# Patient Record
Sex: Female | Born: 1960
Health system: Southern US, Community
[De-identification: ages and names within clinical notes are randomized; demographics above are authoritative.]

## PROBLEM LIST (undated history)

## (undated) DIAGNOSIS — E039 Hypothyroidism, unspecified: Secondary | ICD-10-CM

## (undated) DIAGNOSIS — E079 Disorder of thyroid, unspecified: Secondary | ICD-10-CM

## (undated) DIAGNOSIS — I10 Essential (primary) hypertension: Secondary | ICD-10-CM

## (undated) HISTORY — PX: OTHER SURGICAL HISTORY: SHX169

## (undated) HISTORY — DX: Essential (primary) hypertension: I10

## (undated) HISTORY — DX: Disorder of thyroid, unspecified: E07.9

## (undated) HISTORY — PX: TUBAL LIGATION: SHX77

## (undated) HISTORY — PX: BREAST SURGERY: SHX581

---

## 2008-05-25 ENCOUNTER — Emergency Department (HOSPITAL_COMMUNITY): Admission: EM | Admit: 2008-05-25 | Discharge: 2008-05-25 | Payer: Self-pay | Admitting: Emergency Medicine

## 2008-12-10 ENCOUNTER — Emergency Department (HOSPITAL_COMMUNITY): Admission: AC | Admit: 2008-12-10 | Discharge: 2008-12-10 | Payer: Self-pay

## 2009-10-30 ENCOUNTER — Ambulatory Visit (HOSPITAL_COMMUNITY): Admission: RE | Admit: 2009-10-30 | Discharge: 2009-10-30 | Payer: Self-pay | Admitting: Family Medicine

## 2010-06-02 ENCOUNTER — Emergency Department (HOSPITAL_COMMUNITY)
Admission: EM | Admit: 2010-06-02 | Discharge: 2010-06-02 | Payer: Self-pay | Source: Home / Self Care | Admitting: Emergency Medicine

## 2010-06-11 ENCOUNTER — Emergency Department (HOSPITAL_COMMUNITY)
Admission: EM | Admit: 2010-06-11 | Discharge: 2010-06-11 | Payer: Self-pay | Source: Home / Self Care | Admitting: Emergency Medicine

## 2010-09-20 LAB — CBC
MCHC: 34.5 g/dL (ref 30.0–36.0)
Platelets: 217 10*3/uL (ref 150–400)
RBC: 4.64 MIL/uL (ref 3.87–5.11)

## 2010-09-20 LAB — DIFFERENTIAL
Basophils Absolute: 0 10*3/uL (ref 0.0–0.1)
Basophils Relative: 0 % (ref 0–1)
Monocytes Relative: 8 % (ref 3–12)
Neutro Abs: 2.5 10*3/uL (ref 1.7–7.7)
Neutrophils Relative %: 45 % (ref 43–77)

## 2010-09-20 LAB — POCT I-STAT, CHEM 8
Calcium, Ion: 1.16 mmol/L (ref 1.12–1.32)
HCT: 40 % (ref 36.0–46.0)
TCO2: 23 mmol/L (ref 0–100)

## 2010-09-20 LAB — BASIC METABOLIC PANEL
BUN: 14 mg/dL (ref 6–23)
CO2: 26 mEq/L (ref 19–32)
Calcium: 9.2 mg/dL (ref 8.4–10.5)
Creatinine, Ser: 0.6 mg/dL (ref 0.4–1.2)
GFR calc Af Amer: >60 mL/min (ref >60)

## 2010-09-20 LAB — PROTIME-INR
INR: 1.1 (ref 0.00–1.49)
Prothrombin Time: 14.3 seconds (ref 11.6–15.2)

## 2010-09-20 LAB — URINALYSIS, ROUTINE W REFLEX MICROSCOPIC
Bilirubin Urine: NEGATIVE
Ketones, ur: NEGATIVE mg/dL
Nitrite: POSITIVE — AB
Protein, ur: NEGATIVE mg/dL
Specific Gravity, Urine: 1.013 (ref 1.005–1.030)
Urobilinogen, UA: 0.2 mg/dL (ref 0.0–1.0)

## 2010-09-20 LAB — APTT: aPTT: 28 seconds (ref 24–37)

## 2011-02-11 ENCOUNTER — Other Ambulatory Visit (HOSPITAL_COMMUNITY): Payer: Self-pay | Admitting: Family Medicine

## 2011-02-11 DIAGNOSIS — N644 Mastodynia: Secondary | ICD-10-CM

## 2011-02-16 ENCOUNTER — Ambulatory Visit (HOSPITAL_COMMUNITY): Payer: Self-pay

## 2011-02-23 ENCOUNTER — Ambulatory Visit (HOSPITAL_COMMUNITY)
Admission: RE | Admit: 2011-02-23 | Discharge: 2011-02-23 | Disposition: A | Discharge status: Home / Self Care | Payer: Self-pay | Source: Ambulatory Visit | Attending: Family Medicine | Admitting: Family Medicine

## 2011-02-23 ENCOUNTER — Other Ambulatory Visit (HOSPITAL_COMMUNITY): Payer: Self-pay | Admitting: Family Medicine

## 2011-02-23 DIAGNOSIS — N644 Mastodynia: Secondary | ICD-10-CM

## 2011-03-18 LAB — STREP A DNA PROBE: Group A Strep Probe: NEGATIVE

## 2012-12-03 ENCOUNTER — Telehealth: Payer: Self-pay | Admitting: *Deleted

## 2012-12-03 ENCOUNTER — Ambulatory Visit: Payer: Self-pay | Admitting: Pharmacist

## 2012-12-03 LAB — POCT INR: INR: 1.6

## 2012-12-03 NOTE — Telephone Encounter (Signed)
This is the wrong patient.  INR was suppose to be for W. Cleotis Lema.  DOB: 01/11/1926 Contacted [facility] to verify

## 2012-12-03 NOTE — Telephone Encounter (Signed)
Protime 19.2 INR 1.6 Takes 6 mg on Tues, Thurs and Sat,  4mg  all other days

## 2012-12-03 NOTE — Telephone Encounter (Signed)
This is wrong patient.  Encounter corrected

## 2012-12-03 NOTE — Progress Notes (Signed)
erranous encounter

## 2013-10-21 ENCOUNTER — Encounter: Payer: Self-pay | Admitting: Obstetrics & Gynecology

## 2013-10-28 ENCOUNTER — Ambulatory Visit (INDEPENDENT_AMBULATORY_CARE_PROVIDER_SITE_OTHER): Payer: Self-pay | Admitting: Obstetrics & Gynecology

## 2013-10-28 ENCOUNTER — Encounter: Payer: Self-pay | Admitting: Obstetrics & Gynecology

## 2013-10-28 ENCOUNTER — Other Ambulatory Visit (HOSPITAL_COMMUNITY)
Admission: RE | Admit: 2013-10-28 | Discharge: 2013-10-28 | Disposition: A | Discharge status: Home / Self Care | Payer: Self-pay | Source: Ambulatory Visit | Attending: Obstetrics & Gynecology | Admitting: Obstetrics & Gynecology

## 2013-10-28 VITALS — BP 120/80 | Ht 63.0 in | Wt 163.0 lb

## 2013-10-28 DIAGNOSIS — Z01419 Encounter for gynecological examination (general) (routine) without abnormal findings: Secondary | ICD-10-CM | POA: Insufficient documentation

## 2013-10-28 DIAGNOSIS — Z1151 Encounter for screening for human papillomavirus (HPV): Secondary | ICD-10-CM | POA: Insufficient documentation

## 2013-10-28 DIAGNOSIS — Z124 Encounter for screening for malignant neoplasm of cervix: Secondary | ICD-10-CM

## 2013-10-28 DIAGNOSIS — D259 Leiomyoma of uterus, unspecified: Secondary | ICD-10-CM

## 2013-10-28 DIAGNOSIS — N920 Excessive and frequent menstruation with regular cycle: Secondary | ICD-10-CM | POA: Insufficient documentation

## 2013-10-28 MED ORDER — MEGESTROL ACETATE 40 MG PO TABS: ORAL_TABLET | ORAL | Status: DC

## 2013-10-28 NOTE — Progress Notes (Signed)
Patient ID: Heather Zavala, female   DOB: 01-08-1961, 53 y.o.   MRN: [id number] Heather Zavala is her first visit with [place] G3P3   Presents complaining two-month history of increasingly heavy periods with now bleeding every 2 weeks  Heavy clotting very painful  Say she feels like she is cramping all the time   She also complains of some epigastric symptoms and reflux  She is going to her stressful time she is recently lost her brother   Review of systems  Per history of present illness  No urinary symptoms  The lower GI symptoms  No fever chills or other constitutional symptoms   Exam  Normal external genitalia  Vagina was painted no discharge  Cervix is nulliparous Pap smear is obtained  Uterus 12 week size fibroid uterus tender to palpation  Adnexa negative   Impression  Enlarged fibroid uterus, 12 week size  Menometrorrhagia worsening over the past couple months   Plan  Megestrol to control her cycles  Sonogram in 2 weeks for evaluation of uterus   At the day of her ultrasound will have a visit and a side-opening future management

## 2013-11-11 ENCOUNTER — Other Ambulatory Visit: Payer: Self-pay

## 2013-11-14 ENCOUNTER — Ambulatory Visit (INDEPENDENT_AMBULATORY_CARE_PROVIDER_SITE_OTHER): Payer: Self-pay

## 2013-11-14 ENCOUNTER — Encounter: Payer: Self-pay | Admitting: Obstetrics & Gynecology

## 2013-11-14 ENCOUNTER — Ambulatory Visit (INDEPENDENT_AMBULATORY_CARE_PROVIDER_SITE_OTHER): Payer: Self-pay | Admitting: Obstetrics & Gynecology

## 2013-11-14 VITALS — BP 130/80 | Wt 165.0 lb

## 2013-11-14 DIAGNOSIS — N920 Excessive and frequent menstruation with regular cycle: Secondary | ICD-10-CM

## 2013-11-14 DIAGNOSIS — D259 Leiomyoma of uterus, unspecified: Secondary | ICD-10-CM

## 2013-11-14 MED ORDER — MEGESTROL ACETATE 40 MG PO TABS: ORAL_TABLET | ORAL | Status: DC

## 2013-11-14 NOTE — Progress Notes (Signed)
Patient ID: Heather Zavala, female   DOB: Jan 27, 1961, 53 y.o.   MRN: [id number] Pt here for follow up of her megestrol and to review her sonogram  US Pelvis Complete  11/14/2013   GYNECOLOGIC SONOGRAM   Heather Zavala is a 53 y.o. LMP 10/21/2013 for a pelvic sonogram for  menorrhagia and ?uterine fibroids per PE by Dr.Ripley Lovecchio.  Uterus                    9.0 x 6.2 x 4.1 cm, no obvious myometrial masses  noted within  Endometrium          7.0 mm, symmetrical,   Right ovary             1.9 x 1.2 x 1.1 cm,   Left ovary                1.8 x 1.3 x 1.3 cm,   No free fluid or adnexal masses noted within the pelvis  Technician Comments:  Endom-7.59mm, bilateral adnexa/ovaries appear WNL no free fluid or adnexal  masses noted within the pelvis   [name] 11/14/2013 2:29 PM  Normal pelvic anatomy  [name] 11/14/2013 3:26 PM     No bleeding on megace No problems  No burning with urination, frequency or urgency No nausea, vomiting or diarrhea Nor fever chills or other constitutional symptoms  Exam Normal gyn pathology  Assessment: Perimenopausal menorrhagia  Plan:  Responded to megestrol Pt wants to continue Will continue, rx redone Follow up prn or 1 year  Past Medical History  Diagnosis Date  . Hypertension   . Thyroid disease     Past Surgical History  Procedure Laterality Date  . Cesarean section    . Tubal reversal      OB History   Grav Para Term Preterm Abortions TAB SAB Ect Mult Living                  Allergies  Allergen Reactions  . Asa [Aspirin] Anaphylaxis    History   Social History  . Marital Status: Legally Separated    Spouse Name: N/A    Number of Children: N/A  . Years of Education: N/A   Social History Main Topics  . Smoking status: Never Smoker   . Smokeless tobacco: None  . Alcohol Use: None  . Drug Use: None  . Sexual Activity: Yes   Other Topics Concern  . None   Social History Narrative  . None    Family History  Problem  Relation Age of Onset  . Breast cancer Mother

## 2013-12-27 ENCOUNTER — Ambulatory Visit (INDEPENDENT_AMBULATORY_CARE_PROVIDER_SITE_OTHER): Payer: Self-pay | Admitting: Obstetrics & Gynecology

## 2013-12-27 ENCOUNTER — Encounter: Payer: Self-pay | Admitting: Obstetrics & Gynecology

## 2013-12-27 VITALS — BP 130/90 | Wt 169.0 lb

## 2013-12-27 DIAGNOSIS — N946 Dysmenorrhea, unspecified: Secondary | ICD-10-CM

## 2013-12-27 DIAGNOSIS — N920 Excessive and frequent menstruation with regular cycle: Secondary | ICD-10-CM

## 2013-12-27 LAB — POCT HEMOGLOBIN: HEMOGLOBIN: 14.9 g/dL (ref 12.2–16.2)

## 2013-12-27 MED ORDER — ONDANSETRON HCL 8 MG PO TABS: 8.0000 mg | ORAL_TABLET | Freq: Three times a day (TID) | ORAL | Status: DC | PRN

## 2013-12-27 MED ORDER — HYDROCODONE-ACETAMINOPHEN 5-325 MG PO TABS: 1.0000 | ORAL_TABLET | Freq: Four times a day (QID) | ORAL | Status: DC | PRN

## 2013-12-27 NOTE — Addendum Note (Signed)
Addended by: Doyne Keel on: 12/27/2013 10:42 AM   Modules accepted: Orders

## 2013-12-27 NOTE — Progress Notes (Signed)
Patient ID: Heather Zavala, female   DOB: 26-Nov-1960, 53 y.o.   MRN: [id number] Preoperative History and Physical  Heather Zavala is a 53 y.o. No obstetric history on file. with Patient's last menstrual period was 12/14/2013. admitted for a hysteroscopy uterine curettage endometrial ablation.  Pt was managed on megestrol but now bleeding heaviloy again, sonogram is normal  PMH:    Past Medical History  Diagnosis Date  . Hypertension   . Thyroid disease     PSH:     Past Surgical History  Procedure Laterality Date  . Cesarean section    . Tubal reversal      POb/GynH:      OB History   Grav Para Term Preterm Abortions TAB SAB Ect Mult Living                  SH:   History  Substance Use Topics  . Smoking status: Never Smoker   . Smokeless tobacco: Not on file  . Alcohol Use: Not on file    FH:    Family History  Problem Relation Age of Onset  . Breast cancer Mother      Allergies:  Allergies  Allergen Reactions  . Asa [Aspirin] Anaphylaxis    Medications:      Current outpatient prescriptions:atenolol (TENORMIN) 25 MG tablet, Take 25 mg by mouth daily., Disp: , Rfl: ;  hydrochlorothiazide (HYDRODIURIL) 25 MG tablet, Take 25 mg by mouth daily., Disp: , Rfl: ;  levothyroxine (SYNTHROID, LEVOTHROID) 25 MCG tablet, Take 25 mcg by mouth daily before breakfast., Disp: , Rfl: ;  megestrol (MEGACE) 40 MG tablet, Take 1 tablet a day, Disp: 30 tablet, Rfl: 11 HYDROcodone-acetaminophen (NORCO/VICODIN) 5-325 MG per tablet, Take 1 tablet by mouth every 6 (six) hours as needed., Disp: 30 tablet, Rfl: 0;  ondansetron (ZOFRAN) 8 MG tablet, Take 1 tablet (8 mg total) by mouth every 8 (eight) hours as needed for nausea., Disp: 24 tablet, Rfl: 1  Review of Systems:   Review of Systems  Constitutional: Negative for fever, chills, weight loss, malaise/fatigue and diaphoresis.  HENT: Negative for hearing loss, ear pain, nosebleeds, congestion, sore throat, neck pain, tinnitus and  ear discharge.   Eyes: Negative for blurred vision, double vision, photophobia, pain, discharge and redness.  Respiratory: Negative for cough, hemoptysis, sputum production, shortness of breath, wheezing and stridor.   Cardiovascular: Negative for chest pain, palpitations, orthopnea, claudication, leg swelling and PND.  Gastrointestinal: Positive for abdominal pain. Negative for heartburn, nausea, vomiting, diarrhea, constipation, blood in stool and melena.  Genitourinary: Negative for dysuria, urgency, frequency, hematuria and flank pain.  Musculoskeletal: Negative for myalgias, back pain, joint pain and falls.  Skin: Negative for itching and rash.  Neurological: Negative for dizziness, tingling, tremors, sensory change, speech change, focal weakness, seizures, loss of consciousness, weakness and headaches.  Endo/Heme/Allergies: Negative for environmental allergies and polydipsia. Does not bruise/bleed easily.  Psychiatric/Behavioral: Negative for depression, suicidal ideas, hallucinations, memory loss and substance abuse. The patient is not nervous/anxious and does not have insomnia.      PHYSICAL EXAM:  Blood pressure 130/90, weight 169 lb (76.658 kg), last menstrual period 12/14/2013.    Vitals reviewed. Constitutional: She is oriented to person, place, and time. She appears well-developed and well-nourished.  HENT:  Head: Normocephalic and atraumatic.  Right Ear: External ear normal.  Left Ear: External ear normal.  Nose: Nose normal.  Mouth/Throat: Oropharynx is clear and moist.  Eyes: Conjunctivae and EOM are normal. Pupils  are equal, round, and reactive to light. Right eye exhibits no discharge. Left eye exhibits no discharge. No scleral icterus.  Neck: Normal range of motion. Neck supple. No tracheal deviation present. No thyromegaly present.  Cardiovascular: Normal rate, regular rhythm, normal heart sounds and intact distal pulses.  Exam reveals no gallop and no friction rub.    No murmur heard. Respiratory: Effort normal and breath sounds normal. No respiratory distress. She has no wheezes. She has no rales. She exhibits no tenderness.  GI: Soft. Bowel sounds are normal. She exhibits no distension and no mass. There is tenderness. There is no rebound and no guarding.  Genitourinary:       Vulva is normal without lesions Vagina is pink moist without discharge Cervix normal in appearance and pap is normal Uterus is normal Adnexa is negative with normal sized ovaries by sonogram  Musculoskeletal: Normal range of motion. She exhibits no edema and no tenderness.  Neurological: She is alert and oriented to person, place, and time. She has normal reflexes. She displays normal reflexes. No cranial nerve deficit. She exhibits normal muscle tone. Coordination normal.  Skin: Skin is warm and dry. No rash noted. No erythema. No pallor.  Psychiatric: She has a normal mood and affect. Her behavior is normal. Judgment and thought content normal.    Labs: No results found for this or any previous visit (from the past 336 hour(s)).  EKG: No orders found for this or any previous visit.  Imaging Studies: No results found.    Assessment: Patient Active Problem List   Diagnosis Date Noted  . Excessive or frequent menstruation 10/28/2013    Plan: Hysteroscopy uterine curettage endometrial ablation  EURE,LUTHER H 12/27/2013 10:25 AM

## 2013-12-30 ENCOUNTER — Encounter (HOSPITAL_COMMUNITY): Payer: Self-pay | Admitting: Pharmacy Technician

## 2013-12-31 ENCOUNTER — Encounter (HOSPITAL_COMMUNITY)
Admission: RE | Admit: 2013-12-31 | Discharge: 2013-12-31 | Disposition: A | Discharge status: Home / Self Care | Payer: Self-pay | Source: Ambulatory Visit | Attending: Obstetrics & Gynecology | Admitting: Obstetrics & Gynecology

## 2013-12-31 ENCOUNTER — Encounter (HOSPITAL_COMMUNITY): Payer: Self-pay

## 2013-12-31 ENCOUNTER — Other Ambulatory Visit: Payer: Self-pay

## 2013-12-31 DIAGNOSIS — Z01810 Encounter for preprocedural cardiovascular examination: Secondary | ICD-10-CM | POA: Insufficient documentation

## 2013-12-31 DIAGNOSIS — Z01812 Encounter for preprocedural laboratory examination: Secondary | ICD-10-CM | POA: Insufficient documentation

## 2013-12-31 HISTORY — DX: Hypothyroidism, unspecified: E03.9

## 2013-12-31 LAB — CBC
HCT: 41.2 % (ref 36.0–46.0)
HEMOGLOBIN: 14.5 g/dL (ref 12.0–15.0)
MCH: 29.8 pg (ref 26.0–34.0)
MCHC: 35.2 g/dL (ref 30.0–36.0)
MCV: 84.6 fL (ref 78.0–100.0)
Platelets: 247 10*3/uL (ref 150–400)
RBC: 4.87 MIL/uL (ref 3.87–5.11)
RDW: 13.2 % (ref 11.5–15.5)
WBC: 8.2 10*3/uL (ref 4.0–10.5)

## 2013-12-31 LAB — URINE MICROSCOPIC-ADD ON

## 2013-12-31 LAB — URINALYSIS, ROUTINE W REFLEX MICROSCOPIC
Bilirubin Urine: NEGATIVE
Glucose, UA: NEGATIVE mg/dL
Ketones, ur: NEGATIVE mg/dL
Leukocytes, UA: NEGATIVE
NITRITE: NEGATIVE
PROTEIN: NEGATIVE mg/dL
Specific Gravity, Urine: 1.025 (ref 1.005–1.030)
UROBILINOGEN UA: 0.2 mg/dL (ref 0.0–1.0)
pH: 5.5 (ref 5.0–8.0)

## 2013-12-31 LAB — COMPREHENSIVE METABOLIC PANEL
ALBUMIN: 4 g/dL (ref 3.5–5.2)
ALK PHOS: 44 U/L (ref 39–117)
ALT: 18 U/L (ref 0–35)
ANION GAP: 15 (ref 5–15)
AST: 14 U/L (ref 0–37)
BUN: 19 mg/dL (ref 6–23)
CALCIUM: 9.5 mg/dL (ref 8.4–10.5)
CO2: 24 mEq/L (ref 19–32)
Chloride: 101 mEq/L (ref 96–112)
Creatinine, Ser: 0.81 mg/dL (ref 0.50–1.10)
GFR calc non Af Amer: 83 mL/min — ABNORMAL LOW (ref >90)
GLUCOSE: 144 mg/dL — AB (ref 70–99)
POTASSIUM: 3.7 meq/L (ref 3.7–5.3)
Sodium: 140 mEq/L (ref 137–147)
Total Bilirubin: <0.2 mg/dL — ABNORMAL LOW (ref 0.3–1.2)
Total Protein: 7.6 g/dL (ref 6.0–8.3)

## 2013-12-31 LAB — HCG, QUANTITATIVE, PREGNANCY: hCG, Beta Chain, Quant, S: <1 m[IU]/mL (ref <5)

## 2013-12-31 NOTE — Pre-Procedure Instructions (Signed)
Patient given information to sign up for my chart at home. 

## 2013-12-31 NOTE — Patient Instructions (Signed)
Heather Zavala  12/31/2013   Your procedure is scheduled on:  01/03/2014  Report to [facility] at  73  AM.  Call this number if you have problems the morning of surgery: [phone]   Remember:   Do not eat food or drink liquids after midnight.   Take these medicines the morning of surgery with A SIP OF WATER: atenolol, hydrodiuril, hydrocodone, levothyroxine, zofran   Do not wear jewelry, make-up or nail polish.  Do not wear lotions, powders, or perfumes.   Do not shave 48 hours prior to surgery. Men may shave face and neck.  Do not bring valuables to the hospital.  [facility] is not responsible for any belongings or valuables.               Contacts, dentures or bridgework may not be worn into surgery.  Leave suitcase in the car. After surgery it may be brought to your room.  For patients admitted to the hospital, discharge time is determined by your treatment team.               Patients discharged the day of surgery will not be allowed to drive home.  Name and phone number of your driver: family  Special Instructions: Shower using CHG 2 nights before surgery and the night before surgery.  If you shower the day of surgery use CHG.  Use special wash - you have one bottle of CHG for all showers.  You should use approximately 1/3 of the bottle for each shower.   Please read over the following fact sheets that you were given: Pain Booklet, Coughing and Deep Breathing, Surgical Site Infection Prevention, Anesthesia Post-op Instructions and Care and Recovery After Surgery Endometrial Ablation Endometrial ablation removes the lining of the uterus (endometrium). It is usually a same-day, outpatient treatment. Ablation helps avoid major surgery, such as surgery to remove the cervix and uterus (hysterectomy). After endometrial ablation, you will have little or no menstrual bleeding and may not be able to have children. However, if you are premenopausal, you will need to use a  reliable method of birth control following the procedure because of the small chance that pregnancy can occur. There are different reasons to have this procedure, which include:  Heavy periods.  Bleeding that is causing anemia.  Irregular bleeding.  Bleeding fibroids on the lining inside the uterus if they are smaller than 3 centimeters. This procedure should not be done if:  You want children in the future.  You have severe cramps with your menstrual period.  You have precancerous or cancerous cells in your uterus.  You were recently pregnant.  You have gone through menopause.  You have had major surgery on the uterus, such as a cesarean delivery. LET [facility] CARE PROVIDER KNOW ABOUT:  Any allergies you have.  All medicines you are taking, including vitamins, herbs, eye drops, creams, and over-the-counter medicines.  Previous problems you or members of your family have had with the use of anesthetics.  Any blood disorders you have.  Previous surgeries you have had.  Medical conditions you have. RISKS AND COMPLICATIONS  Generally, this is a safe procedure. However, as with any procedure, complications can occur. Possible complications include:  Perforation of the uterus.  Bleeding.  Infection of the uterus, bladder, or vagina.  Injury to surrounding organs.  An air bubble to the lung (air embolus).  Pregnancy following the procedure.  Failure of the  procedure to help the problem, requiring hysterectomy.  Decreased ability to diagnose cancer in the lining of the uterus. BEFORE THE PROCEDURE  The lining of the uterus must be tested to make sure there is no pre-cancerous or cancer cells present.  An ultrasound may be performed to look at the size of the uterus and to check for abnormalities.  Medicines may be given to thin the lining of the uterus. PROCEDURE  During the procedure, your health care provider will use a tool called a resectoscope to help  see inside your uterus. There are different ways to remove the lining of your uterus.   Radiofrequency - This method uses a radiofrequency-alternating electric current to remove the lining of the uterus.  Cryotherapy - This method uses extreme cold to freeze the lining of the uterus.  Heated-Free Liquid - This method uses heated salt (saline) solution to remove the lining of the uterus.  Microwave - This method uses high-energy microwaves to heat up the lining of the uterus to remove it.  Thermal balloon - This method involves inserting a catheter with a balloon tip into the uterus. The balloon tip is filled with heated fluid to remove the lining of the uterus. AFTER THE PROCEDURE  After your procedure, do not have sexual intercourse or insert anything into your vagina until permitted by your health care provider. After the procedure, you may experience:  Cramps.  Vaginal discharge.  Frequent urination. Document Released: 04/08/2004 Document Revised: 01/30/2013 Document Reviewed: 10/31/2012 [facility] Patient Information 2015 [place], [place]. This information is not intended to replace advice given to you by your health care provider. Make sure you discuss any questions you have with your health care provider. Dilation and Curettage or Vacuum Curettage Dilation and curettage (D&C) and vacuum curettage are minor procedures. A D&C involves stretching (dilation) the cervix and scraping (curettage) the inside lining of the womb (uterus). During a D&C, tissue is gently scraped from the inside lining of the uterus. During a vacuum curettage, the lining and tissue in the uterus are removed with the use of gentle suction.  Curettage may be performed to either diagnose or treat a problem. As a diagnostic procedure, curettage is performed to examine tissues from the uterus. A diagnostic curettage may be performed for the following symptoms:   Irregular bleeding in the uterus.   Bleeding with the  development of clots.   Spotting between menstrual periods.   Prolonged menstrual periods.   Bleeding after menopause.   No menstrual period (amenorrhea).   A change in size and shape of the uterus.  As a treatment procedure, curettage may be performed for the following reasons:   Removal of an IUD (intrauterine device).   Removal of retained placenta after giving birth. Retained placenta can cause an infection or bleeding severe enough to require transfusions.   Abortion.   Miscarriage.   Removal of polyps inside the uterus.   Removal of uncommon types of noncancerous lumps (fibroids).  LET [facility] CARE PROVIDER KNOW ABOUT:   Any allergies you have.   All medicines you are taking, including vitamins, herbs, eye drops, creams, and over-the-counter medicines.   Previous problems you or members of your family have had with the use of anesthetics.   Any blood disorders you have.   Previous surgeries you have had.   Medical conditions you have. RISKS AND COMPLICATIONS  Generally, this is a safe procedure. However, as with any procedure, complications can occur. Possible complications include:  Excessive bleeding.  Infection of the uterus.   Damage to the cervix.   Development of scar tissue (adhesions) inside the uterus, later causing abnormal amounts of menstrual bleeding.   Complications from the general anesthetic, if a general anesthetic is used.   Putting a hole (perforation) in the uterus. This is rare.  BEFORE THE PROCEDURE   Eat and drink before the procedure only as directed by your health care provider.   Arrange for someone to take you home.  PROCEDURE  This procedure usually takes about 15-30 minutes.  You will be given one of the following:  A medicine that numbs the area in and around the cervix (local anesthetic).   A medicine to make you sleep through the procedure (general anesthetic).  You will lie on your  back with your legs in stirrups.   A warm metal or plastic instrument (speculum) will be placed in your vagina to keep it open and to allow the health care provider to see the cervix.  There are two ways in which your cervix can be softened and dilated. These include:   Taking a medicine.   Having thin rods (laminaria) inserted into your cervix.   A curved tool (curette) will be used to scrape cells from the inside lining of the uterus. In some cases, gentle suction is applied with the curette. The curette will then be removed.  AFTER THE PROCEDURE   You will rest in the recovery area until you are stable and are ready to go home.   You may feel sick to your stomach (nauseous) or throw up (vomit) if you were given a general anesthetic.   You may have a sore throat if a tube was placed in your throat during general anesthesia.   You may have light cramping and bleeding. This may last for 2 days to 2 weeks after the procedure.   Your uterus needs to make a new lining after the procedure. This may make your next period late. Document Released: 05/30/2005 Document Revised: 01/30/2013 Document Reviewed: 12/27/2012 [facility] Patient Information 2015 [place], [place]. This information is not intended to replace advice given to you by your health care provider. Make sure you discuss any questions you have with your health care provider. Hysteroscopy Hysteroscopy is a procedure used for looking inside the womb (uterus). It may be done for various reasons, including:  To evaluate abnormal bleeding, fibroid (benign, noncancerous) tumors, polyps, scar tissue (adhesions), and possibly cancer of the uterus.  To look for lumps (tumors) and other uterine growths.  To look for causes of why a woman cannot get pregnant (infertility), causes of recurrent loss of pregnancy (miscarriages), or a lost intrauterine device (IUD).  To perform a sterilization by blocking the fallopian tubes from inside  the uterus. In this procedure, a thin, flexible tube with a tiny light and camera on the end of it (hysteroscope) is used to look inside the uterus. A hysteroscopy should be done right after a menstrual period to be sure you are not pregnant. LET [facility] CARE PROVIDER KNOW ABOUT:   Any allergies you have.  All medicines you are taking, including vitamins, herbs, eye drops, creams, and over-the-counter medicines.  Previous problems you or members of your family have had with the use of anesthetics.  Any blood disorders you have.  Previous surgeries you have had.  Medical conditions you have. RISKS AND COMPLICATIONS  Generally, this is a safe procedure. However, as with any procedure, complications can occur. Possible complications include:  Putting a hole in the uterus.  Excessive bleeding.  Infection.  Damage to the cervix.  Injury to other organs.  Allergic reaction to medicines.  Too much fluid used in the uterus for the procedure. BEFORE THE PROCEDURE   Ask your health care provider about changing or stopping any regular medicines.  Do not take aspirin or blood thinners for 1 week before the procedure, or as directed by your health care provider. These can cause bleeding.  If you smoke, do not smoke for 2 weeks before the procedure.  In some cases, a medicine is placed in the cervix the day before the procedure. This medicine makes the cervix have a larger opening (dilate). This makes it easier for the instrument to be inserted into the uterus during the procedure.  Do not eat or drink anything for at least 8 hours before the surgery.  Arrange for someone to take you home after the procedure. PROCEDURE   You may be given a medicine to relax you (sedative). You may also be given one of the following:  A medicine that numbs the area around the cervix (local anesthetic).  A medicine that makes you sleep through the procedure (general anesthetic).  The  hysteroscope is inserted through the vagina into the uterus. The camera on the hysteroscope sends a picture to a TV screen. This gives the surgeon a good view inside the uterus.  During the procedure, air or a liquid is put into the uterus, which allows the surgeon to see better.  Sometimes, tissue is gently scraped from inside the uterus. These tissue samples are sent to a lab for testing. AFTER THE PROCEDURE   If you had a general anesthetic, you may be groggy for a couple hours after the procedure.  If you had a local anesthetic, you will be able to go home as soon as you are stable and feel ready.  You may have some cramping. This normally lasts for a couple days.  You may have bleeding, which varies from light spotting for a few days to menstrual-like bleeding for 3-7 days. This is normal.  If your test results are not back during the visit, make an appointment with your health care provider to find out the results. Document Released: 09/05/2000 Document Revised: 03/20/2013 Document Reviewed: 12/27/2012 [facility] Patient Information 2015 [place], [place]. This information is not intended to replace advice given to you by your health care provider. Make sure you discuss any questions you have with your health care provider. PATIENT INSTRUCTIONS POST-ANESTHESIA  IMMEDIATELY FOLLOWING SURGERY:  Do not drive or operate machinery for the first twenty four hours after surgery.  Do not make any important decisions for twenty four hours after surgery or while taking narcotic pain medications or sedatives.  If you develop intractable nausea and vomiting or a severe headache please notify your doctor immediately.  FOLLOW-UP:  Please make an appointment with your surgeon as instructed. You do not need to follow up with anesthesia unless specifically instructed to do so.  WOUND CARE INSTRUCTIONS (if applicable):  Keep a dry clean dressing on the anesthesia/puncture wound site if there is drainage.   Once the wound has quit draining you may leave it open to air.  Generally you should leave the bandage intact for twenty four hours unless there is drainage.  If the epidural site drains for more than 36-48 hours please call the anesthesia department.  QUESTIONS?:  Please feel free to call your physician or the hospital operator if  you have any questions, and they will be happy to assist you.

## 2014-01-03 ENCOUNTER — Encounter (HOSPITAL_COMMUNITY)
Admission: RE | Disposition: A | Discharge status: Home / Self Care | Payer: Self-pay | Source: Ambulatory Visit | Attending: Obstetrics & Gynecology

## 2014-01-03 ENCOUNTER — Encounter (HOSPITAL_COMMUNITY): Payer: Self-pay | Admitting: Emergency Medicine

## 2014-01-03 ENCOUNTER — Ambulatory Visit (HOSPITAL_COMMUNITY): Payer: Self-pay | Admitting: Anesthesiology

## 2014-01-03 ENCOUNTER — Encounter (HOSPITAL_COMMUNITY): Payer: Self-pay | Admitting: Anesthesiology

## 2014-01-03 ENCOUNTER — Ambulatory Visit (HOSPITAL_COMMUNITY)
Admission: RE | Admit: 2014-01-03 | Discharge: 2014-01-03 | Disposition: A | Discharge status: Home / Self Care | Payer: Self-pay | Source: Ambulatory Visit | Attending: Obstetrics & Gynecology | Admitting: Obstetrics & Gynecology

## 2014-01-03 DIAGNOSIS — N920 Excessive and frequent menstruation with regular cycle: Secondary | ICD-10-CM

## 2014-01-03 DIAGNOSIS — E039 Hypothyroidism, unspecified: Secondary | ICD-10-CM | POA: Insufficient documentation

## 2014-01-03 DIAGNOSIS — I10 Essential (primary) hypertension: Secondary | ICD-10-CM | POA: Insufficient documentation

## 2014-01-03 DIAGNOSIS — Z79899 Other long term (current) drug therapy: Secondary | ICD-10-CM | POA: Insufficient documentation

## 2014-01-03 DIAGNOSIS — Z886 Allergy status to analgesic agent status: Secondary | ICD-10-CM | POA: Insufficient documentation

## 2014-01-03 DIAGNOSIS — N946 Dysmenorrhea, unspecified: Secondary | ICD-10-CM | POA: Insufficient documentation

## 2014-01-03 HISTORY — PX: DILITATION & CURRETTAGE/HYSTROSCOPY WITH THERMACHOICE ABLATION: SHX5569

## 2014-01-03 LAB — GLUCOSE, CAPILLARY: GLUCOSE-CAPILLARY: 75 mg/dL (ref 70–99)

## 2014-01-03 SURGERY — DILATATION & CURETTAGE/HYSTEROSCOPY WITH THERMACHOICE ABLATION
Anesthesia: General | Site: Uterus

## 2014-01-03 MED ORDER — LACTATED RINGERS IV SOLN
INTRAVENOUS | Status: DC | PRN
  Administered 2014-01-03 (×2): via INTRAVENOUS

## 2014-01-03 MED ORDER — DEXAMETHASONE SODIUM PHOSPHATE 4 MG/ML IJ SOLN
4.0000 mg | Freq: Once | INTRAMUSCULAR | Status: AC
  Administered 2014-01-03: 4 mg via INTRAVENOUS

## 2014-01-03 MED ORDER — CEFAZOLIN SODIUM-DEXTROSE 2-3 GM-% IV SOLR
2.0000 g | INTRAVENOUS | Status: AC
  Administered 2014-01-03: 2 g via INTRAVENOUS

## 2014-01-03 MED ORDER — MIDAZOLAM HCL 2 MG/2ML IJ SOLN
1.0000 mg | INTRAMUSCULAR | Status: DC | PRN
  Administered 2014-01-03: 2 mg via INTRAVENOUS

## 2014-01-03 MED ORDER — PROPOFOL 10 MG/ML IV BOLUS
INTRAVENOUS | Status: DC | PRN
  Administered 2014-01-03: 150 mg via INTRAVENOUS

## 2014-01-03 MED ORDER — LIDOCAINE HCL (CARDIAC) 20 MG/ML IV SOLN
INTRAVENOUS | Status: DC | PRN
  Administered 2014-01-03: 50 mg via INTRAVENOUS

## 2014-01-03 MED ORDER — FENTANYL CITRATE 0.05 MG/ML IJ SOLN
INTRAMUSCULAR | Status: AC
  Filled 2014-01-03: qty 2

## 2014-01-03 MED ORDER — DEXTROSE 5 % IV SOLN
INTRAVENOUS | Status: DC | PRN
  Administered 2014-01-03: 500 mL via INTRAVENOUS

## 2014-01-03 MED ORDER — SODIUM CHLORIDE 0.9 % IR SOLN
Status: DC | PRN
  Administered 2014-01-03 (×2): 1000 mL

## 2014-01-03 MED ORDER — CEFAZOLIN SODIUM-DEXTROSE 2-3 GM-% IV SOLR
INTRAVENOUS | Status: AC
  Filled 2014-01-03: qty 50

## 2014-01-03 MED ORDER — FENTANYL CITRATE 0.05 MG/ML IJ SOLN
25.0000 ug | INTRAMUSCULAR | Status: AC
  Administered 2014-01-03 (×2): 25 ug via INTRAVENOUS

## 2014-01-03 MED ORDER — ONDANSETRON HCL 4 MG/2ML IJ SOLN
4.0000 mg | Freq: Once | INTRAMUSCULAR | Status: AC
  Administered 2014-01-03: 4 mg via INTRAVENOUS

## 2014-01-03 MED ORDER — ONDANSETRON HCL 4 MG/2ML IJ SOLN
INTRAMUSCULAR | Status: AC
  Filled 2014-01-03: qty 2

## 2014-01-03 MED ORDER — DEXAMETHASONE SODIUM PHOSPHATE 4 MG/ML IJ SOLN
INTRAMUSCULAR | Status: AC
  Filled 2014-01-03: qty 1

## 2014-01-03 MED ORDER — FENTANYL CITRATE 0.05 MG/ML IJ SOLN
INTRAMUSCULAR | Status: AC
  Filled 2014-01-03: qty 5

## 2014-01-03 MED ORDER — FENTANYL CITRATE 0.05 MG/ML IJ SOLN
25.0000 ug | INTRAMUSCULAR | Status: DC | PRN
  Administered 2014-01-03 (×2): 50 ug via INTRAVENOUS

## 2014-01-03 MED ORDER — MIDAZOLAM HCL 2 MG/2ML IJ SOLN
INTRAMUSCULAR | Status: AC
  Filled 2014-01-03: qty 2

## 2014-01-03 MED ORDER — ONDANSETRON HCL 4 MG/2ML IJ SOLN: 4.0000 mg | Freq: Once | INTRAMUSCULAR | Status: DC | PRN

## 2014-01-03 MED ORDER — FENTANYL CITRATE 0.05 MG/ML IJ SOLN
INTRAMUSCULAR | Status: DC | PRN
  Administered 2014-01-03 (×3): 50 ug via INTRAVENOUS
  Administered 2014-01-03 (×4): 25 ug via INTRAVENOUS

## 2014-01-03 MED ORDER — LACTATED RINGERS IV SOLN
INTRAVENOUS | Status: DC
  Administered 2014-01-03: 75 mL/h via INTRAVENOUS

## 2014-01-03 SURGICAL SUPPLY — 34 items
BAG DECANTER FOR FLEXI CONT (MISCELLANEOUS) ×3 IMPLANT
BAG HAMPER (MISCELLANEOUS) ×3 IMPLANT
CATH THERMACHOICE III (CATHETERS) ×3 IMPLANT
CLOTH BEACON ORANGE TIMEOUT ST (SAFETY) ×3 IMPLANT
COVER LIGHT HANDLE STERIS (MISCELLANEOUS) ×6 IMPLANT
COVER MAYO STAND XLG (DRAPE) ×3 IMPLANT
FORMALIN 10 PREFIL 120ML (MISCELLANEOUS) ×3 IMPLANT
GAUZE SPONGE 4X4 16PLY XRAY LF (GAUZE/BANDAGES/DRESSINGS) ×3 IMPLANT
GLOVE BIO SURGEON STRL SZ7.5 (GLOVE) ×2 IMPLANT
GLOVE BIOGEL PI IND STRL 7.5 (GLOVE) IMPLANT
GLOVE BIOGEL PI IND STRL 8 (GLOVE) ×1 IMPLANT
GLOVE BIOGEL PI INDICATOR 7.5 (GLOVE) ×2
GLOVE BIOGEL PI INDICATOR 8 (GLOVE) ×2
GLOVE ECLIPSE 8.0 STRL XLNG CF (GLOVE) ×3 IMPLANT
GLOVE EXAM NITRILE LRG STRL (GLOVE) ×2 IMPLANT
GLOVE EXAM NITRILE MD LF STRL (GLOVE) ×2 IMPLANT
GOWN STRL REUS W/TWL LRG LVL3 (GOWN DISPOSABLE) ×3 IMPLANT
GOWN STRL REUS W/TWL XL LVL3 (GOWN DISPOSABLE) ×3 IMPLANT
INST SET HYSTEROSCOPY (KITS) ×3 IMPLANT
IV D5W 500ML (IV SOLUTION) ×3 IMPLANT
IV NS 1000ML (IV SOLUTION) ×3
IV NS 1000ML BAXH (IV SOLUTION) ×1 IMPLANT
KIT ROOM TURNOVER AP CYSTO (KITS) ×3 IMPLANT
MANIFOLD NEPTUNE II (INSTRUMENTS) ×3 IMPLANT
MARKER SKIN DUAL TIP RULER LAB (MISCELLANEOUS) ×3 IMPLANT
NS IRRIG 1000ML POUR BTL (IV SOLUTION) ×3 IMPLANT
PACK BASIC III (CUSTOM PROCEDURE TRAY) ×3
PACK SRG BSC III STRL LF ECLPS (CUSTOM PROCEDURE TRAY) ×1 IMPLANT
PAD ARMBOARD 7.5X6 YLW CONV (MISCELLANEOUS) ×3 IMPLANT
PAD TELFA 3X4 1S STER (GAUZE/BANDAGES/DRESSINGS) ×3 IMPLANT
SET BASIN LINEN APH (SET/KITS/TRAYS/PACK) ×3 IMPLANT
SET IRRIG Y TYPE TUR BLADDER L (SET/KITS/TRAYS/PACK) ×3 IMPLANT
SHEET LAVH (DRAPES) ×3 IMPLANT
YANKAUER SUCT BULB TIP 10FT TU (MISCELLANEOUS) ×3 IMPLANT

## 2014-01-03 NOTE — Anesthesia Preprocedure Evaluation (Signed)
Anesthesia Evaluation  Patient identified by MRN, date of birth, ID band Patient awake    Reviewed: Allergy & Precautions, H&P , NPO status , Patient's Chart, lab work & pertinent test results  Airway Mallampati: I TM Distance: >3 FB Neck ROM: Full    Dental  (+) Teeth Intact   Pulmonary  breath sounds clear to auscultation        Cardiovascular hypertension, Pt. on medications Rhythm:Regular Rate:Normal     Neuro/Psych    GI/Hepatic negative GI ROS,   Endo/Other  Hypothyroidism   Renal/GU      Musculoskeletal   Abdominal   Peds  Hematology   Anesthesia Other Findings   Reproductive/Obstetrics                           Anesthesia Physical Anesthesia Plan  ASA: II  Anesthesia Plan: General   Post-op Pain Management:    Induction: Intravenous  Airway Management Planned: LMA  Additional Equipment:   Intra-op Plan:   Post-operative Plan: Extubation in OR  Informed Consent: I have reviewed the patients History and Physical, chart, labs and discussed the procedure including the risks, benefits and alternatives for the proposed anesthesia with the patient or authorized representative who has indicated his/her understanding and acceptance.     Plan Discussed with:   Anesthesia Plan Comments:         Anesthesia Quick Evaluation

## 2014-01-03 NOTE — Anesthesia Procedure Notes (Signed)
Procedure Name: LMA Insertion Date/Time: 01/03/2014 12:39 PM Performed by: Andree Elk, Nareh Matzke A Pre-anesthesia Checklist: Patient identified, Timeout performed, Emergency Drugs available, Suction available and Patient being monitored Patient Re-evaluated:Patient Re-evaluated prior to inductionOxygen Delivery Method: Circle system utilized Preoxygenation: Pre-oxygenation with 100% oxygen Intubation Type: IV induction Ventilation: Mask ventilation without difficulty LMA: LMA inserted LMA Size: 4.0 Number of attempts: 1 Placement Confirmation: positive ETCO2 and breath sounds checked- equal and bilateral Tube secured with: Tape Dental Injury: Teeth and Oropharynx as per pre-operative assessment

## 2014-01-03 NOTE — H&P (Signed)
Preoperative History and Physical   Heather Zavala is a 53 y.o. No obstetric history on file. with Patient's last menstrual period was 12/14/2013. admitted for a hysteroscopy uterine curettage endometrial ablation.   Pt was managed on megestrol but now bleeding heaviloy again, sonogram is normal   PMH:     Past Medical History   Diagnosis  Date   .  Hypertension     .  Thyroid disease          PSH:     Past Surgical History   Procedure  Laterality  Date   .  Cesarean section       .  Tubal reversal            POb/GynH:       OB History     Grav  Para  Term  Preterm  Abortions  TAB  SAB  Ect  Mult  Living                                  SH:    History   Substance Use Topics   .  Smoking status:  Never Smoker    .  Smokeless tobacco:  Not on file   .  Alcohol Use:  Not on file        FH:    Family History   Problem  Relation  Age of Onset   .  Breast cancer  Mother            Allergies:   Allergies   Allergen  Reactions   .  Asa [Aspirin]  Anaphylaxis        Medications:      Current outpatient prescriptions:atenolol (TENORMIN) 25 MG tablet, Take 25 mg by mouth daily., Disp: , Rfl: ;  hydrochlorothiazide (HYDRODIURIL) 25 MG tablet, Take 25 mg by mouth daily., Disp: , Rfl: ;  levothyroxine (SYNTHROID, LEVOTHROID) 25 MCG tablet, Take 25 mcg by mouth daily before breakfast., Disp: , Rfl: ;  megestrol (MEGACE) 40 MG tablet, Take 1 tablet a day, Disp: 30 tablet, Rfl: 11 HYDROcodone-acetaminophen (NORCO/VICODIN) 5-325 MG per tablet, Take 1 tablet by mouth every 6 (six) hours as needed., Disp: 30 tablet, Rfl: 0;  ondansetron (ZOFRAN) 8 MG tablet, Take 1 tablet (8 mg total) by mouth every 8 (eight) hours as needed for nausea., Disp: 24 tablet, Rfl: 1   Review of Systems:    Review of Systems  Constitutional: Negative for fever, chills, weight loss, malaise/fatigue and diaphoresis.  HENT: Negative for hearing loss, ear pain, nosebleeds,  congestion, sore throat, neck pain, tinnitus and ear discharge.   Eyes: Negative for blurred vision, double vision, photophobia, pain, discharge and redness.  Respiratory: Negative for cough, hemoptysis, sputum production, shortness of breath, wheezing and stridor.   Cardiovascular: Negative for chest pain, palpitations, orthopnea, claudication, leg swelling and PND.  Gastrointestinal: Positive for abdominal pain. Negative for heartburn, nausea, vomiting, diarrhea, constipation, blood in stool and melena.  Genitourinary: Negative for dysuria, urgency, frequency, hematuria and flank pain.  Musculoskeletal: Negative for myalgias, back pain, joint pain and falls.  Skin: Negative for itching and rash.  Neurological: Negative for dizziness, tingling, tremors, sensory change, speech change, focal weakness, seizures, loss of consciousness, weakness and headaches.  Endo/Heme/Allergies: Negative for environmental allergies and polydipsia. Does not bruise/bleed easily.  Psychiatric/Behavioral: Negative for depression, suicidal ideas, hallucinations, memory loss and substance abuse. The patient is not nervous/anxious  and does not have insomnia.         PHYSICAL EXAM:   Blood pressure 130/90, weight 169 lb (76.658 kg), last menstrual period 12/14/2013.     Vitals reviewed. Constitutional: She is oriented to person, place, and time. She appears well-developed and well-nourished.  HENT:   Head: Normocephalic and atraumatic.  Right Ear: External ear normal.  Left Ear: External ear normal.   Nose: Nose normal.   Mouth/Throat: Oropharynx is clear and moist.  Eyes: Conjunctivae and EOM are normal. Pupils are equal, round, and reactive to light. Right eye exhibits no discharge. Left eye exhibits no discharge. No scleral icterus.  Neck: Normal range of motion. Neck supple. No tracheal deviation present. No thyromegaly present.  Cardiovascular: Normal rate, regular rhythm, normal heart sounds and intact  distal pulses.  Exam reveals no gallop and no friction rub.    No murmur heard. Respiratory: Effort normal and breath sounds normal. No respiratory distress. She has no wheezes. She has no rales. She exhibits no tenderness.  GI: Soft. Bowel sounds are normal. She exhibits no distension and no mass. There is tenderness. There is no rebound and no guarding.  Genitourinary:       Vulva is normal without lesions Vagina is pink moist without discharge Cervix normal in appearance and pap is normal Uterus is normal Adnexa is negative with normal sized ovaries by sonogram  Musculoskeletal: Normal range of motion. She exhibits no edema and no tenderness.  Neurological: She is alert and oriented to person, place, and time. She has normal reflexes. She displays normal reflexes. No cranial nerve deficit. She exhibits normal muscle tone. Coordination normal.  Skin: Skin is warm and dry. No rash noted. No erythema. No pallor.  Psychiatric: She has a normal mood and affect. Her behavior is normal. Judgment and thought content normal.      Labs: No results found for this or any previous visit (from the past 336 hour(s)).   EKG: No orders found for this or any previous visit.   Imaging Studies: No results found.       Assessment: Patient Active Problem List     Diagnosis  Date Noted   .  Excessive or frequent menstruation  10/28/2013        Plan: Hysteroscopy uterine curettage endometrial ablation   [name] H 12:03 PM 01/03/2014

## 2014-01-03 NOTE — Transfer of Care (Signed)
Immediate Anesthesia Transfer of Care Note  Patient: Heather Zavala  Procedure(s) Performed: Procedure(s) with comments: DILATATION & CURETTAGE/HYSTEROSCOPY WITH THERMACHOICE ABLATION (N/A) - total therapy time- 9 minutes 32 seconds  Patient Location: PACU  Anesthesia Type:General  Level of Consciousness: awake, alert , oriented and patient cooperative  Airway & Oxygen Therapy: Patient Spontanous Breathing and Patient connected to face mask oxygen  Post-op Assessment: Report given to PACU RN and Post -op Vital signs reviewed and stable  Post vital signs: Reviewed and stable  Complications: No apparent anesthesia complications

## 2014-01-03 NOTE — Op Note (Signed)
Preoperative diagnosis: Menometrorrhagia                                        Dysmenorrhea   Postoperative diagnoses: Same as above   Procedure: Hysteroscopy, uterine curettage, endometrial ablation  Surgeon: Elonda Husky MD  Anesthesia: Laryngeal mask airway  Findings: The endometrium was normal. There were no fibroid or other abnormalities.  Description of operation: The patient was taken to the operating room and placed in the supine position. She underwent general anesthesia using the laryngeal mask airway. She was placed in the dorsal lithotomy position and prepped and draped in the usual sterile fashion. A Graves speculum was placed and the anterior cervical lip was grasped with a single-tooth tenaculum. The cervix was dilated serially to allow passage of the hysteroscope. Diagnostic hysteroscopy was performed and was found to be normal. A vigorous uterine curettage was then performed and all tissue sent to pathology for evaluation. The ThermaChoice 3 endometrial ablation balloon was then used were 19 cc of D5W was required to maintain a pressure of 190-200 mm of mercury throughout the procedure. Toatl therapy time was 9:52.  All of the equipment worked well throughout the procedure. All of the fluid was returned at the end of the procedure. The patient was awakened from anesthesia and taken to the recovery room in good stable condition all counts were correct. She received 2 g of Ancef preoperatively. She will be discharged from the recovery room and followed up in the office in 1- 2 weeks.  EURE,LUTHER H 01/03/2014 1:24 PM

## 2014-01-03 NOTE — Discharge Instructions (Signed)
Endometrial Ablation Endometrial ablation removes the lining of the uterus (endometrium). It is usually a same-day, outpatient treatment. Ablation helps avoid major surgery, such as surgery to remove the cervix and uterus (hysterectomy). After endometrial ablation, you will have little or no menstrual bleeding and may not be able to have children. However, if you are premenopausal, you will need to use a reliable method of birth control following the procedure because of the small chance that pregnancy can occur. There are different reasons to have this procedure, which include:  Heavy periods.  Bleeding that is causing anemia.  Irregular bleeding.  Bleeding fibroids on the lining inside the uterus if they are smaller than 3 centimeters. This procedure should not be done if:  You want children in the future.  You have severe cramps with your menstrual period.  You have precancerous or cancerous cells in your uterus.  You were recently pregnant.  You have gone through menopause.  You have had major surgery on the uterus, such as a cesarean delivery. LET YOUR HEALTH CARE PROVIDER KNOW ABOUT:  Any allergies you have.  All medicines you are taking, including vitamins, herbs, eye drops, creams, and over-the-counter medicines.  Previous problems you or members of your family have had with the use of anesthetics.  Any blood disorders you have.  Previous surgeries you have had.  Medical conditions you have. RISKS AND COMPLICATIONS  Generally, this is a safe procedure. However, as with any procedure, complications can occur. Possible complications include:  Perforation of the uterus.  Bleeding.  Infection of the uterus, bladder, or vagina.  Injury to surrounding organs.  An air bubble to the lung (air embolus).  Pregnancy following the procedure.  Failure of the procedure to help the problem, requiring hysterectomy.  Decreased ability to diagnose cancer in the lining of  the uterus. BEFORE THE PROCEDURE  The lining of the uterus must be tested to make sure there is no pre-cancerous or cancer cells present.  An ultrasound may be performed to look at the size of the uterus and to check for abnormalities.  Medicines may be given to thin the lining of the uterus. PROCEDURE  During the procedure, your health care provider will use a tool called a resectoscope to help see inside your uterus. There are different ways to remove the lining of your uterus.   Radiofrequency - This method uses a radiofrequency-alternating electric current to remove the lining of the uterus.  Cryotherapy - This method uses extreme cold to freeze the lining of the uterus.  Heated-Free Liquid - This method uses heated salt (saline) solution to remove the lining of the uterus.  Microwave - This method uses high-energy microwaves to heat up the lining of the uterus to remove it.  Thermal balloon - This method involves inserting a catheter with a balloon tip into the uterus. The balloon tip is filled with heated fluid to remove the lining of the uterus. AFTER THE PROCEDURE  After your procedure, do not have sexual intercourse or insert anything into your vagina until permitted by your health care provider. After the procedure, you may experience:  Cramps.  Vaginal discharge.  Frequent urination. Document Released: 04/08/2004 Document Revised: 01/30/2013 Document Reviewed: 10/31/2012 Exit[employer] Patient Information 2015 [employer]. This information is not intended to replace advice given to you by your health care provider. Make sure you discuss any questions you have with your health care provider.  

## 2014-01-03 NOTE — Anesthesia Postprocedure Evaluation (Signed)
  Anesthesia Post-op Note  Patient: Heather Zavala  Procedure(s) Performed: Procedure(s) with comments: DILATATION & CURETTAGE/HYSTEROSCOPY WITH THERMACHOICE ABLATION (N/A) - total therapy time- 9 minutes 32 seconds  Patient Location: PACU  Anesthesia Type:General  Level of Consciousness: awake, alert , oriented and patient cooperative  Airway and Oxygen Therapy: Patient Spontanous Breathing and Patient connected to face mask oxygen  Post-op Pain: mild  Post-op Assessment: Post-op Vital signs reviewed, Patient's Cardiovascular Status Stable, Respiratory Function Stable, Patent Airway, No signs of Nausea or vomiting and Pain level controlled  Post-op Vital Signs: Reviewed and stable  Last Vitals:  Filed Vitals:   01/03/14 1225  BP: 120/75  Pulse:   Temp:   Resp: 73    Complications: No apparent anesthesia complications

## 2014-01-06 ENCOUNTER — Encounter (HOSPITAL_COMMUNITY): Payer: Self-pay | Admitting: Obstetrics & Gynecology

## 2014-01-10 ENCOUNTER — Encounter: Payer: Self-pay | Admitting: Obstetrics & Gynecology

## 2014-01-13 ENCOUNTER — Encounter: Payer: Self-pay | Admitting: Obstetrics & Gynecology

## 2014-01-13 ENCOUNTER — Ambulatory Visit (INDEPENDENT_AMBULATORY_CARE_PROVIDER_SITE_OTHER): Payer: Self-pay | Admitting: Obstetrics & Gynecology

## 2014-01-13 VITALS — BP 152/88 | Ht 63.5 in | Wt 170.0 lb

## 2014-01-13 DIAGNOSIS — Z9889 Other specified postprocedural states: Secondary | ICD-10-CM

## 2014-01-13 MED ORDER — TRIAMTERENE-HCTZ 75-50 MG PO TABS: 1.0000 | ORAL_TABLET | Freq: Every day | ORAL | Status: DC

## 2014-01-13 NOTE — Progress Notes (Signed)
Patient ID: Heather Zavala, female   DOB: December 24, 1960, 53 y.o.   MRN: [id number] Post op from hysteroscopy uterine curettage endometrial ablation  Doing well no complaints  Blood pressure 152/88, height 5' 3.5" (1.613 m), weight 170 lb (77.111 kg), last menstrual period 12/14/2013.  NEFG Vagina pink moist appropriate post ablation discharge Cervix non tender Uterus non tender  Normal post op ablation

## 2015-02-03 ENCOUNTER — Other Ambulatory Visit: Payer: Self-pay | Admitting: Obstetrics & Gynecology

## 2015-02-19 ENCOUNTER — Ambulatory Visit: Payer: Self-pay | Admitting: Obstetrics & Gynecology

## 2015-04-16 ENCOUNTER — Emergency Department (HOSPITAL_COMMUNITY): Payer: Self-pay

## 2015-04-16 ENCOUNTER — Encounter (HOSPITAL_COMMUNITY): Payer: Self-pay

## 2015-04-16 ENCOUNTER — Emergency Department (HOSPITAL_COMMUNITY)
Admission: EM | Admit: 2015-04-16 | Discharge: 2015-04-16 | Disposition: A | Discharge status: Home / Self Care | Payer: Self-pay | Attending: Emergency Medicine | Admitting: Emergency Medicine

## 2015-04-16 DIAGNOSIS — S161XXA Strain of muscle, fascia and tendon at neck level, initial encounter: Secondary | ICD-10-CM | POA: Insufficient documentation

## 2015-04-16 DIAGNOSIS — Y998 Other external cause status: Secondary | ICD-10-CM | POA: Insufficient documentation

## 2015-04-16 DIAGNOSIS — Y92410 Unspecified street and highway as the place of occurrence of the external cause: Secondary | ICD-10-CM | POA: Insufficient documentation

## 2015-04-16 DIAGNOSIS — S20211A Contusion of right front wall of thorax, initial encounter: Secondary | ICD-10-CM | POA: Insufficient documentation

## 2015-04-16 DIAGNOSIS — I10 Essential (primary) hypertension: Secondary | ICD-10-CM | POA: Insufficient documentation

## 2015-04-16 DIAGNOSIS — E039 Hypothyroidism, unspecified: Secondary | ICD-10-CM | POA: Insufficient documentation

## 2015-04-16 DIAGNOSIS — Z79899 Other long term (current) drug therapy: Secondary | ICD-10-CM | POA: Insufficient documentation

## 2015-04-16 DIAGNOSIS — Y9389 Activity, other specified: Secondary | ICD-10-CM | POA: Insufficient documentation

## 2015-04-16 DIAGNOSIS — R42 Dizziness and giddiness: Secondary | ICD-10-CM | POA: Insufficient documentation

## 2015-04-16 MED ORDER — HYDROCODONE-ACETAMINOPHEN 5-325 MG PO TABS
1.0000 | ORAL_TABLET | Freq: Once | ORAL | Status: AC
  Administered 2015-04-16: 1 via ORAL
  Filled 2015-04-16: qty 1

## 2015-04-16 MED ORDER — CYCLOBENZAPRINE HCL 5 MG PO TABS: 5.0000 mg | ORAL_TABLET | Freq: Three times a day (TID) | ORAL | Status: AC | PRN

## 2015-04-16 NOTE — Discharge Instructions (Signed)
Take ibuprofen in addition to the muscle relaxant. Do not drive while taking the muscle relaxant as it will make you sleepy. Return if your symptoms worsen.
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
## 2016-02-11 ENCOUNTER — Other Ambulatory Visit: Payer: Self-pay | Admitting: Obstetrics & Gynecology
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
## 2020-04-24 ENCOUNTER — Other Ambulatory Visit: Payer: Self-pay | Admitting: Internal Medicine

## 2020-04-28 ENCOUNTER — Other Ambulatory Visit: Payer: Self-pay | Admitting: Internal Medicine

## 2020-04-28 DIAGNOSIS — N6029 Fibroadenosis of unspecified breast: Secondary | ICD-10-CM

## 2020-04-28 DIAGNOSIS — Z803 Family history of malignant neoplasm of breast: Secondary | ICD-10-CM

## 2020-08-13 ENCOUNTER — Encounter (INDEPENDENT_AMBULATORY_CARE_PROVIDER_SITE_OTHER): Payer: Self-pay | Admitting: *Deleted

## 2021-04-15 ENCOUNTER — Encounter (INDEPENDENT_AMBULATORY_CARE_PROVIDER_SITE_OTHER): Payer: Self-pay | Admitting: *Deleted

## 2021-10-11 ENCOUNTER — Encounter (INDEPENDENT_AMBULATORY_CARE_PROVIDER_SITE_OTHER): Payer: Self-pay | Admitting: *Deleted

## 2021-10-18 ENCOUNTER — Telehealth: Payer: Self-pay

## 2021-10-18 NOTE — Telephone Encounter (Signed)
NOTES SCANNED TO REFERRAL 

## 2021-10-27 ENCOUNTER — Ambulatory Visit: Payer: 59 | Admitting: Interventional Cardiology

## 2021-10-27 ENCOUNTER — Encounter: Payer: Self-pay | Admitting: Interventional Cardiology

## 2021-10-27 VITALS — BP 124/74 | HR 60 | Ht 63.0 in | Wt 192.0 lb

## 2021-10-27 DIAGNOSIS — R011 Cardiac murmur, unspecified: Secondary | ICD-10-CM

## 2021-10-27 DIAGNOSIS — E782 Mixed hyperlipidemia: Secondary | ICD-10-CM | POA: Diagnosis not present

## 2021-10-27 DIAGNOSIS — I10 Essential (primary) hypertension: Secondary | ICD-10-CM | POA: Diagnosis not present

## 2021-10-27 DIAGNOSIS — R002 Palpitations: Secondary | ICD-10-CM

## 2021-10-27 NOTE — Patient Instructions (Signed)
Medication Instructions:  ?Your physician recommends that you continue on your current medications as directed. Please refer to the Current Medication list given to you today. ? ?*If you need a refill on your cardiac medications before your next appointment, please call your pharmacy* ? ? ?Lab Work: ?none ?If you have labs (blood work) drawn today and your tests are completely normal, you will receive your results only by: ?MyChart Message (if you have MyChart) OR ?A paper copy in the mail ?If you have any lab test that is abnormal or we need to change your treatment, we will call you to review the results. ? ? ?Testing/Procedures: ?Your physician has requested that you have an echocardiogram. Echocardiography is a painless test that uses sound waves to create images of your heart. It provides your doctor with information about the size and shape of your heart and how well your heart?s chambers and valves are working. This procedure takes approximately one hour. There are no restrictions for this procedure. ? ? ? ?Follow-Up: ?At [facility], you and your health needs are our priority.  As part of our continuing mission to provide you with exceptional heart care, we have created designated Provider Care Teams.  These Care Teams include your primary Cardiologist (physician) and Advanced Practice Providers (APPs -  Physician Assistants and Nurse Practitioners) who all work together to provide you with the care you need, when you need it. ? ?We recommend signing up for the patient portal called "MyChart".  Sign up information is provided on this After Visit Summary.  MyChart is used to connect with patients for Virtual Visits (Telemedicine).  Patients are able to view lab/test results, encounter notes, upcoming appointments, etc.  Non-urgent messages can be sent to your provider as well.   ?To learn more about what you can do with MyChart, go to [email or web address].   ? ?Your next appointment:   ?As needed ? ?The  format for your next appointment:   ?In Person ? ?Provider:   ?Jayadeep Varanasi, MD   ? ? ?Other Instructions ?  ? ?Important Information About Sugar ? ? ? ? ? ? ?

## 2021-10-27 NOTE — Progress Notes (Signed)
?  ?Cardiology Office Note ? ? ?Date:  10/27/2021  ? ?ID:  Heather Zavala, DOB July 11, 1960, MRN [id number] ? ?PCP:  Celene Squibb, MD  ? ? ?Chief Complaint  ?Patient presents with  ? Establish Care  ? ?murmur ? ?Wt Readings from Last 3 Encounters:  ?10/27/21 192 lb (87.1 kg)  ?04/16/15 165 lb (74.8 kg)  ?01/13/14 170 lb (77.1 kg)  ?  ? ?  ?History of Present Illness: ?Heather Zavala is a 61 y.o. female who is being seen today for the evaluation of heart murmur at the request of Donnamae Jude, FNP.  ? ?Murmur was noted in April 2023.  She had never been told she had a heart murmur before.  She also noted some palpitations and admitted to being under a lot of stress.  Her father had leukemia and recently passed away. ? ?Palpitations- Worse when she os on lying on her left side.  Symptoms have been constant for a few years.  ? 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  She goes up stairs without any shortness of breath or chest discomfort. ? ?Denies : Chest pain. Dizziness. Leg edema. Nitroglycerin use. Orthopnea. Palpitations. Paroxysmal nocturnal dyspnea. Shortness of breath. Syncope.  ? ? ? ? ?Past Medical History:  ?Diagnosis Date  ? Hypertension   ? Hypothyroidism   ? Thyroid disease   ? ? ?Past Surgical History:  ?Procedure Laterality Date  ? BREAST SURGERY Right   ? x2  ? CESAREAN SECTION    ? x3  ? DILITATION & CURRETTAGE/HYSTROSCOPY WITH THERMACHOICE ABLATION N/A 01/03/2014  ? Procedure: DILATATION & CURETTAGE/HYSTEROSCOPY WITH THERMACHOICE ABLATION;  Surgeon: [name], MD;  Location: [facility];  Service: Gynecology;  Laterality: N/A;  total therapy time- 9 minutes 32 seconds  ? TUBAL LIGATION    ? TUBAL REVERSAL    ? ? ? ?Current Outpatient Medications  ?Medication Sig Dispense Refill  ? ALPRAZolam (XANAX) 0.5 MG tablet Take 0.5 mg by mouth at bedtime as needed for anxiety.    ? atenolol (TENORMIN) 50 MG tablet Take 50 mg by mouth daily.    ? atorvastatin (LIPITOR) 40 MG tablet Take 40 mg by  mouth at bedtime.    ? atorvastatin (LIPITOR) 80 MG tablet Take 80 mg by mouth daily.    ? fluticasone (FLONASE) 50 MCG/ACT nasal spray Place into both nostrils daily.    ? levothyroxine (SYNTHROID) 112 MCG tablet Take 112 mcg by mouth daily before breakfast.    ? triamterene-hydrochlorothiazide (MAXZIDE) 75-50 MG tablet TAKE ONE TABLET BY MOUTH ONCE DAILY 30 tablet 11  ? cyclobenzaprine (FLEXERIL) 5 MG tablet Take 1 tablet (5 mg total) by mouth 3 (three) times daily as needed for muscle spasms. (Patient not taking: Reported on 10/27/2021) 30 tablet 0  ? ?No current facility-administered medications for this visit.  ? ? ?Allergies:   Asa [aspirin]  ? ? ?Social History:  The patient  reports that she has never smoked. She has never used smokeless tobacco. She reports that she does not drink alcohol and does not use drugs.  ? ?Family History:  The patient's family history includes Breast cancer in her mother; CAD in her father; Cystic fibrosis in her brother; Heart murmur in her daughter; Hypothyroidism in her daughter.  ? ? ?ROS:  Please see the history of present illness.   Otherwise, review of systems are positive for palpitations.   All other systems are reviewed and negative.  ? ? ?  PHYSICAL EXAM: ?VS:  BP 124/74   Pulse 60   Ht '5\' 3"'$  (1.6 m)   Wt 192 lb (87.1 kg)   SpO2 97%   BMI 34.01 kg/m?  , BMI Body mass index is 34.01 kg/m?. ?GEN: Well nourished, well developed, in no acute distress ?HEENT: normal ?Neck: no JVD, carotid bruits, or masses ?Cardiac: RRR; 1/6 earlysystolic murmur, no rubs, or gallops,no edema  ?Respiratory:  clear to auscultation bilaterally, normal work of breathing ?GI: soft, nontender, nondistended, + BS ?MS: no deformity or atrophy ?Skin: warm and dry, no rash ?Neuro:  Strength and sensation are intact ?Psych: euthymic mood, full affect ? ? ?EKG:   ?The ekg ordered today demonstrates normal ECG ? ? ?Recent Labs: ?No results found for requested labs within last 8760 hours.  ? ?Lipid  Panel ?No results found for: CHOL, TRIG, HDL, CHOLHDL, VLDL, LDLCALC, LDLDIRECT ?  ?Other studies Reviewed: ?Additional studies/ records that were reviewed today with results demonstrating: HDL 67 LDL 121 triglycerides 58 total cholesterol 199. ? ? ?ASSESSMENT AND PLAN: ? ?Heart murmur: Very soft systolic murmur.  PLan for echo.  Exam does not show evidence of severe structural heart disease, but will confirm with echocardiogram.  No evidence of congestive heart failure. ?HTN: The current medical regimen is effective;  continue present plan and medications.  Whole food, plant-based diet.  High-fiber diet.  Avoid processed foods. ?Hyperlipidemia: LDL 121 on atorvastatin 80 mg daily.  Hopefully, will improve with more exercise.  We spoke about getting to be target below.  If LDL stays elevated despite optimal lifestyle, could consider adding ezetimibe. ?Palpitations: Worse when she is on lying on her left side.  Symptoms are short-lived.  No associated lightheadedness or syncope.  Symptoms have been stable for years.  We will hold off on any monitoring.  She has been under a lot of stress recently with the recent death of her father. ? ? ?Current medicines are reviewed at length with the patient today.  The patient concerns regarding her medicines were addressed. ? ?The following changes have been made:  No change ? ?Labs/ tests ordered today include: echo ?No orders of the defined types were placed in this encounter. ? ? ?Recommend 150 minutes/week of aerobic exercise ?Low fat, low carb, high fiber diet recommended ? ?Disposition:   FU as needed ? ? ?Signed, ?Larae Grooms, MD  ?10/27/2021 2:47 PM    ?[facility] ?[address], [place], [place]  [address] ?Phone: [phone]; Fax: [phone]  ? ?

## 2021-10-29 ENCOUNTER — Other Ambulatory Visit (HOSPITAL_COMMUNITY): Payer: Self-pay | Admitting: Family Medicine

## 2021-10-29 DIAGNOSIS — Z13820 Encounter for screening for osteoporosis: Secondary | ICD-10-CM

## 2021-10-29 DIAGNOSIS — Z1231 Encounter for screening mammogram for malignant neoplasm of breast: Secondary | ICD-10-CM

## 2021-11-22 ENCOUNTER — Ambulatory Visit (HOSPITAL_COMMUNITY): Payer: 59 | Attending: Cardiovascular Disease

## 2021-11-22 DIAGNOSIS — R011 Cardiac murmur, unspecified: Secondary | ICD-10-CM | POA: Diagnosis not present

## 2021-11-22 LAB — ECHOCARDIOGRAM COMPLETE
Area-P 1/2: 3.75 cm2
P 1/2 time: 425 msec
S' Lateral: 2.9 cm

## 2021-12-06 ENCOUNTER — Encounter: Payer: Self-pay | Admitting: *Deleted

## 2021-12-16 ENCOUNTER — Telehealth: Payer: Self-pay | Admitting: Interventional Cardiology

## 2021-12-16 NOTE — Telephone Encounter (Signed)
Left message for patient to callback to discuss results of echocardiogram.  In message, stated no follow-up was needed based on the results, but to callback if she wished to discuss further.

## 2021-12-16 NOTE — Telephone Encounter (Signed)
Pt returning a call from 12/06/21 about echo results. She asked that you call her new number [phone]

## 2022-05-30 DIAGNOSIS — E039 Hypothyroidism, unspecified: Secondary | ICD-10-CM | POA: Diagnosis not present

## 2022-05-30 DIAGNOSIS — E785 Hyperlipidemia, unspecified: Secondary | ICD-10-CM | POA: Diagnosis not present

## 2022-06-03 DIAGNOSIS — Z79890 Hormone replacement therapy (postmenopausal): Secondary | ICD-10-CM | POA: Diagnosis not present

## 2022-06-03 DIAGNOSIS — Z79899 Other long term (current) drug therapy: Secondary | ICD-10-CM | POA: Diagnosis not present

## 2022-06-03 DIAGNOSIS — E7801 Familial hypercholesterolemia: Secondary | ICD-10-CM | POA: Diagnosis not present

## 2022-06-03 DIAGNOSIS — T466X5A Adverse effect of antihyperlipidemic and antiarteriosclerotic drugs, initial encounter: Secondary | ICD-10-CM | POA: Diagnosis not present

## 2022-06-03 DIAGNOSIS — R69 Illness, unspecified: Secondary | ICD-10-CM | POA: Diagnosis not present

## 2022-06-03 DIAGNOSIS — E039 Hypothyroidism, unspecified: Secondary | ICD-10-CM | POA: Diagnosis not present

## 2022-06-03 DIAGNOSIS — R011 Cardiac murmur, unspecified: Secondary | ICD-10-CM | POA: Diagnosis not present

## 2022-06-03 DIAGNOSIS — I10 Essential (primary) hypertension: Secondary | ICD-10-CM | POA: Diagnosis not present

## 2022-06-03 DIAGNOSIS — E785 Hyperlipidemia, unspecified: Secondary | ICD-10-CM | POA: Diagnosis not present

## 2022-06-03 DIAGNOSIS — J309 Allergic rhinitis, unspecified: Secondary | ICD-10-CM | POA: Diagnosis not present
